# Patient Record
Sex: Female | Born: 1975 | Race: Black or African American | Hispanic: No | Smoking: Never smoker
Health system: Southern US, Community
[De-identification: ages and names within clinical notes are randomized; demographics above are authoritative.]

---

## 2013-07-27 ENCOUNTER — Emergency Department (HOSPITAL_BASED_OUTPATIENT_CLINIC_OR_DEPARTMENT_OTHER)
Admission: EM | Admit: 2013-07-27 | Discharge: 2013-07-27 | Disposition: A | Discharge status: Home / Self Care | Payer: BC Managed Care – PPO | Attending: Emergency Medicine | Admitting: Emergency Medicine

## 2013-07-27 ENCOUNTER — Encounter (HOSPITAL_BASED_OUTPATIENT_CLINIC_OR_DEPARTMENT_OTHER): Payer: Self-pay | Admitting: Emergency Medicine

## 2013-07-27 ENCOUNTER — Emergency Department (HOSPITAL_BASED_OUTPATIENT_CLINIC_OR_DEPARTMENT_OTHER): Payer: BC Managed Care – PPO

## 2013-07-27 DIAGNOSIS — Y929 Unspecified place or not applicable: Secondary | ICD-10-CM | POA: Insufficient documentation

## 2013-07-27 DIAGNOSIS — Y939 Activity, unspecified: Secondary | ICD-10-CM | POA: Insufficient documentation

## 2013-07-27 DIAGNOSIS — W010XXA Fall on same level from slipping, tripping and stumbling without subsequent striking against object, initial encounter: Secondary | ICD-10-CM | POA: Insufficient documentation

## 2013-07-27 DIAGNOSIS — S82899A Other fracture of unspecified lower leg, initial encounter for closed fracture: Secondary | ICD-10-CM | POA: Insufficient documentation

## 2013-07-27 DIAGNOSIS — S82402A Unspecified fracture of shaft of left fibula, initial encounter for closed fracture: Secondary | ICD-10-CM

## 2013-07-27 MED ORDER — IBUPROFEN 800 MG PO TABS: 800.0000 mg | ORAL_TABLET | Freq: Three times a day (TID) | ORAL | Status: AC

## 2013-07-27 MED ORDER — IBUPROFEN 800 MG PO TABS: 800.0000 mg | ORAL_TABLET | Freq: Three times a day (TID) | ORAL | Status: DC

## 2013-07-27 MED ORDER — HYDROCODONE-ACETAMINOPHEN 5-325 MG PO TABS: 2.0000 | ORAL_TABLET | ORAL | Status: DC | PRN

## 2013-07-27 NOTE — ED Notes (Signed)
pa at bedside. 

## 2013-07-27 NOTE — Discharge Instructions (Signed)
Cast or Splint Care °Casts and splints support injured limbs and keep bones from moving while they heal. It is important to care for your cast or splint at home.   °HOME CARE INSTRUCTIONS °· Keep the cast or splint uncovered during the drying period. It can take 24 to 48 hours to dry if it is made of plaster. A fiberglass cast will dry in less than 1 hour. °· Do not rest the cast on anything harder than a pillow for the first 24 hours. °· Do not put weight on your injured limb or apply pressure to the cast until your health care provider gives you permission. °· Keep the cast or splint dry. Wet casts or splints can lose their shape and may not support the limb as well. A wet cast that has lost its shape can also create harmful pressure on your skin when it dries. Also, wet skin can become infected. °· Cover the cast or splint with a plastic bag when bathing or when out in the rain or snow. If the cast is on the trunk of the body, take sponge baths until the cast is removed. °· If your cast does become wet, dry it with a towel or a blow dryer on the cool setting only. °· Keep your cast or splint clean. Soiled casts may be wiped with a moistened cloth. °· Do not place any hard or soft foreign objects under your cast or splint, such as cotton, toilet paper, lotion, or powder. °· Do not try to scratch the skin under the cast with any object. The object could get stuck inside the cast. Also, scratching could lead to an infection. If itching is a problem, use a blow dryer on a cool setting to relieve discomfort. °· Do not trim or cut your cast or remove padding from inside of it. °· Exercise all joints next to the injury that are not immobilized by the cast or splint. For example, if you have a long leg cast, exercise the hip joint and toes. If you have an arm cast or splint, exercise the shoulder, elbow, thumb, and fingers. °· Elevate your injured arm or leg on 1 or 2 pillows for the first 1 to 3 days to decrease  swelling and pain. It is best if you can comfortably elevate your cast so it is higher than your heart. °SEEK MEDICAL CARE IF:  °· Your cast or splint cracks. °· Your cast or splint is too tight or too loose. °· You have unbearable itching inside the cast. °· Your cast becomes wet or develops a soft spot or area. °· You have a bad smell coming from inside your cast. °· You get an object stuck under your cast. °· Your skin around the cast becomes red or raw. °· You have new pain or worsening pain after the cast has been applied. °SEEK IMMEDIATE MEDICAL CARE IF:  °· You have fluid leaking through the cast. °· You are unable to move your fingers or toes. °· You have discolored (blue or white), cool, painful, or very swollen fingers or toes beyond the cast. °· You have tingling or numbness around the injured area. °· You have severe pain or pressure under the cast. °· You have any difficulty with your breathing or have shortness of breath. °· You have chest pain. °Document Released: 05/18/2000 Document Revised: 03/11/2013 Document Reviewed: 11/27/2012 °[employer]® Patient Information ©2014 [employer]. ° °Ankle Fracture °A fracture is a break in the bone. A cast or splint   is used to protect and keep your injured bone from moving.  °HOME CARE INSTRUCTIONS  °· Use your crutches as directed. °· To lessen the swelling, keep the injured leg elevated while sitting or lying down. °· Apply ice to the injury for 15-20 minutes, 03-04 times per day while awake for 2 days. Put the ice in a plastic bag and place a thin towel between the bag of ice and your cast. °· If you have a plaster or fiberglass cast: °· Do not try to scratch the skin under the cast using sharp or pointed objects. °· Check the skin around the cast every day. You may put lotion on any red or sore areas. °· Keep your cast dry and clean. °· If you have a plaster splint: °· Wear the splint as directed. °· You may loosen the elastic around the splint if your toes  become numb, tingle, or turn cold or blue. °· Do not put pressure on any part of your cast or splint; it may break. Rest your cast only on a pillow the first 24 hours until it is fully hardened. °· Your cast or splint can be protected during bathing with a plastic bag. Do not lower the cast or splint into water. °· Take medications as directed by your caregiver. Only take over-the-counter or prescription medicines for pain, discomfort, or fever as directed by your caregiver. °· Do not drive a vehicle until your caregiver specifically tells you it is safe to do so. °· If your caregiver has given you a follow-up appointment, it is very important to keep that appointment. Not keeping the appointment could result in a chronic or permanent injury, pain, and disability. If there is any problem keeping the appointment, you must call back to this facility for assistance. °SEEK IMMEDIATE MEDICAL CARE IF:  °· Your cast gets damaged or breaks. °· You have continued severe pain or more swelling than you did before the cast was put on. °· Your skin or toenails below the injury turn blue or gray, or feel cold or numb. °· There is a bad smell or new stains and/or purulent (pus like) drainage coming from under the cast. °If you do not have a window in your cast for observing the wound, a discharge or minor bleeding may show up as a stain on the outside of your cast. Report these findings to your caregiver. °MAKE SURE YOU:  °· Understand these instructions. °· Will watch your condition. °· Will get help right away if you are not doing well or get worse. °Document Released: 05/18/2000 Document Revised: 08/13/2011 Document Reviewed: 12/18/2012 °[employer]® Patient Information ©2014 [employer]. ° °

## 2013-07-27 NOTE — ED Provider Notes (Signed)
Medical screening examination/treatment/procedure(s) were performed by non-physician practitioner and as supervising physician I was immediately available for consultation/collaboration.  Gailen Venne, MD 07/27/13 2345   Medical screening examination/treatment/procedure(s) were performed by non-physician practitioner and as supervising physician I was immediately available for consultation/collaboration.     Gerhard Munch, MD 07/27/13 [phone]

## 2013-07-27 NOTE — ED Provider Notes (Signed)
CSN: [id number]     Arrival date & time 07/27/13  1454 History   First MD Initiated Contact with Patient 07/27/13 1634     Chief Complaint  Patient presents with  . Ankle Pain     (Consider location/radiation/quality/duration/timing/severity/associated sxs/prior Treatment) Patient is a 38 y.o. female presenting with ankle pain. The history is provided by the patient. No language interpreter was used.  Ankle Pain Location:  Ankle Time since incident:  8 days Ankle location:  L ankle Pain details:    Quality:  Aching   Radiates to:  Does not radiate   Severity:  Moderate   Onset quality:  Gradual   Duration:  8 days   Timing:  Constant   Progression:  Worsening Chronicity:  New Dislocation: no   Prior injury to area:  No Relieved by:  Nothing Worsened by:  Nothing tried Ineffective treatments:  None tried   History reviewed. No pertinent past medical history. History reviewed. No pertinent past surgical history. No family history on file. History  Substance Use Topics  . Smoking status: Never Smoker   . Smokeless tobacco: Not on file  . Alcohol Use: Yes   OB History   Grav Para Term Preterm Abortions TAB SAB Ect Mult Living                 Review of Systems  Musculoskeletal: Positive for joint swelling and myalgias.  All other systems reviewed and are negative.      Allergies  Review of patient's allergies indicates no known allergies.  Home Medications  No current outpatient prescriptions on file. BP 171/89  Pulse 66  Temp(Src) 98.4 F (36.9 C) (Oral)  Resp 16  Ht 5\' 5"  (1.651 m)  Wt 195 lb (88.451 kg)  BMI 32.45 kg/m2  SpO2 98%  LMP 07/19/2013 Physical Exam  Nursing note and vitals reviewed. Constitutional: She is oriented to person, place, and time. She appears well-developed and well-nourished.  HENT:  Head: Normocephalic.  Eyes: Conjunctivae are normal. Pupils are equal, round, and reactive to light.  Musculoskeletal: She exhibits  tenderness.  Swollen tender left ankle,   Decreased range of motion,   Right ankle slight swelling,  From,  nv and ns intact  Neurological: She is alert and oriented to person, place, and time. She has normal reflexes.  Skin: Skin is warm.  Psychiatric: She has a normal mood and affect.    ED Course  Procedures (including critical care time) Labs Review Labs Reviewed - No data to display Imaging Review Dg Ankle Complete Left  07/27/2013   CLINICAL DATA:  Fall 8 days ago.  Left ankle pain and swelling.  EXAM: LEFT ANKLE COMPLETE - 3+ VIEW  COMPARISON:  None.  FINDINGS: The patient has a nondisplaced distal fibular fracture with associated soft tissue swelling. No other acute bony or joint abnormality is identified.  IMPRESSION: Nondisplaced distal fibular fracture with associated soft tissue swelling.   Electronically Signed   By: Drusilla Kanner M.D.   On: 07/27/2013 15:55   Dg Foot Complete Left  07/27/2013   CLINICAL DATA:  Left foot pain.  EXAM: LEFT FOOT - COMPLETE 3+ VIEW  COMPARISON:  None.  FINDINGS: Nondisplaced distal fibular fracture is identified. Image bones otherwise appear normal. No dislocation.  IMPRESSION: Nondisplaced distal fibular fracture.  Otherwise negative.   Electronically Signed   By: Drusilla Kanner M.D.   On: 07/27/2013 15:55    EKG Interpretation   None  MDM   Final diagnoses:  Fracture of fibula, left, closed  Rx for ibuprofen and hydrocodone Cam walker,  Crutches  Follow up with Dr. Allena NapoleonHudnall    Toben Acuna K Ilsa Bonello, PA-C 07/27/13 1751

## 2013-07-27 NOTE — ED Notes (Signed)
Pt c/o left ankle, foot and lower leg swelling and painful since tripping and falling 8 days ago.

## 2013-07-29 ENCOUNTER — Encounter: Payer: Self-pay | Admitting: Family Medicine

## 2013-07-29 ENCOUNTER — Ambulatory Visit (INDEPENDENT_AMBULATORY_CARE_PROVIDER_SITE_OTHER): Payer: BC Managed Care – PPO | Admitting: Family Medicine

## 2013-07-29 VITALS — BP 189/79 | HR 87 | Ht 65.0 in | Wt 190.0 lb

## 2013-07-29 DIAGNOSIS — S99912A Unspecified injury of left ankle, initial encounter: Secondary | ICD-10-CM

## 2013-07-29 DIAGNOSIS — S8990XA Unspecified injury of unspecified lower leg, initial encounter: Secondary | ICD-10-CM

## 2013-07-29 DIAGNOSIS — S99929A Unspecified injury of unspecified foot, initial encounter: Secondary | ICD-10-CM

## 2013-07-29 DIAGNOSIS — S99919A Unspecified injury of unspecified ankle, initial encounter: Secondary | ICD-10-CM

## 2013-07-29 DIAGNOSIS — S82409A Unspecified fracture of shaft of unspecified fibula, initial encounter for closed fracture: Secondary | ICD-10-CM

## 2013-07-29 DIAGNOSIS — S82402A Unspecified fracture of shaft of left fibula, initial encounter for closed fracture: Secondary | ICD-10-CM

## 2013-07-29 MED ORDER — HYDROCODONE-ACETAMINOPHEN 5-325 MG PO TABS: 1.0000 | ORAL_TABLET | Freq: Four times a day (QID) | ORAL | Status: AC | PRN

## 2013-07-29 NOTE — Patient Instructions (Signed)
You have a distal fibula fracture. Use crutches and do not put weight on this leg. Wear cam walker when you're going to be up and around - ok to take off to clean, ice, sleep (unless painful). Ice 15 minutes at a time 3-4 times a day. Elevate above the level of your heart as much as possible. Aleve 2 tabs twice a day with food OR ibuprofen 600mg  three times a day with food for pain and inflammation. Norco as needed for severe pain (no driving on this medicine). No driving with boot on. Follow up with me in 2 weeks. Out of work in meantime.

## 2013-07-31 ENCOUNTER — Encounter: Payer: Self-pay | Admitting: Family Medicine

## 2013-07-31 DIAGNOSIS — S82402A Unspecified fracture of shaft of left fibula, initial encounter for closed fracture: Secondary | ICD-10-CM | POA: Insufficient documentation

## 2013-07-31 NOTE — Progress Notes (Signed)
Patient ID: Allison Hansen, female   DOB: [birth year], 38 y.o.   MRN: [id number]  PCP: No PCP Per Patient  Subjective:   HPI: Patient is a 38 y.o. female here for left leg injury.  Patient reports on 2/15 she was on a concrete sidewalk and missed a step, inverted left ankle. Immediate pain, lots of swelling lateral ankle. Hobbled after this - could put a little weight on it. Went to ED where x-rays showed a distal fibula fracture into level of ankle joint. Taking ibuprofen, hydrocodone. Using cam walker and crutches. Elevating also. No prior injuries.  No past medical history on file.  Current Outpatient Prescriptions on File Prior to Visit  Medication Sig Dispense Refill  . ibuprofen (ADVIL,MOTRIN) 800 MG tablet Take 1 tablet (800 mg total) by mouth 3 (three) times daily.  21 tablet  0   No current facility-administered medications on file prior to visit.    No past surgical history on file.  No Known Allergies  History   Social History  . Marital Status: Divorced    Spouse Name: N/A    Number of Children: N/A  . Years of Education: N/A   Occupational History  . Not on file.   Social History Main Topics  . Smoking status: Never Smoker   . Smokeless tobacco: Not on file  . Alcohol Use: Yes  . Drug Use: No  . Sexual Activity: Not on file   Other Topics Concern  . Not on file   Social History Narrative  . No narrative on file    Family History  Problem Relation Age of Onset  . Hypertension Mother   . Sudden death Neg Hx   . Hyperlipidemia Neg Hx   . Heart attack Neg Hx   . Diabetes Neg Hx     BP 189/79  Pulse 87  Ht 5\' 5"  (1.651 m)  Wt 190 lb (86.183 kg)  BMI 31.62 kg/m2  LMP 07/19/2013  Review of Systems: See HPI above.    Objective:  Physical Exam:  Gen: NAD  Left ankle/foot: Mod swelling, mild bruising laterally.  No other deformity. Did not test ROM with known fracture. TTP lateral malleolus about level of ankle joint.  Mild pain over  ATFL.  No pain medial malleolus or over deltoid ligament. Deferred ant drawer and talar tilt.   Deferred syndesmotic compression. Thompsons test negative. NV intact distally.    Assessment & Plan:  1. Left distal fibula fracture - fracture line extends to level of ankle joint.  Mortise intact and no tenderness medially to suggest unstable ankle joint.  Will start with conservative treatment - no weight bearing in cam boot with crutches.  Icing, elevating, nsaids with norco for severe pain.  Out of work.  F/u in 2 weeks to repeat radiographs. Expect 6-8 weeks from injury for complete healing.

## 2013-07-31 NOTE — Assessment & Plan Note (Signed)
Left distal fibula fracture - fracture line extends to level of ankle joint.  Mortise intact and no tenderness medially to suggest unstable ankle joint.  Will start with conservative treatment - no weight bearing in cam boot with crutches.  Icing, elevating, nsaids with norco for severe pain.  Out of work.  F/u in 2 weeks to repeat radiographs. Expect 6-8 weeks from injury for complete healing.

## 2013-08-12 ENCOUNTER — Ambulatory Visit (HOSPITAL_BASED_OUTPATIENT_CLINIC_OR_DEPARTMENT_OTHER)
Admission: RE | Admit: 2013-08-12 | Discharge: 2013-08-12 | Disposition: A | Discharge status: Home / Self Care | Payer: BC Managed Care – PPO | Source: Ambulatory Visit | Attending: Family Medicine | Admitting: Family Medicine

## 2013-08-12 ENCOUNTER — Ambulatory Visit (INDEPENDENT_AMBULATORY_CARE_PROVIDER_SITE_OTHER): Payer: BC Managed Care – PPO | Admitting: Family Medicine

## 2013-08-12 ENCOUNTER — Encounter: Payer: Self-pay | Admitting: Family Medicine

## 2013-08-12 VITALS — BP 176/91 | HR 86 | Ht 65.0 in | Wt 190.0 lb

## 2013-08-12 DIAGNOSIS — S99912A Unspecified injury of left ankle, initial encounter: Secondary | ICD-10-CM

## 2013-08-12 DIAGNOSIS — Z4789 Encounter for other orthopedic aftercare: Secondary | ICD-10-CM | POA: Insufficient documentation

## 2013-08-12 DIAGNOSIS — S82402A Unspecified fracture of shaft of left fibula, initial encounter for closed fracture: Secondary | ICD-10-CM

## 2013-08-12 DIAGNOSIS — S99919A Unspecified injury of unspecified ankle, initial encounter: Secondary | ICD-10-CM

## 2013-08-12 DIAGNOSIS — S8990XA Unspecified injury of unspecified lower leg, initial encounter: Secondary | ICD-10-CM

## 2013-08-12 DIAGNOSIS — S82409A Unspecified fracture of shaft of unspecified fibula, initial encounter for closed fracture: Secondary | ICD-10-CM

## 2013-08-12 DIAGNOSIS — S99929A Unspecified injury of unspecified foot, initial encounter: Secondary | ICD-10-CM

## 2013-08-13 ENCOUNTER — Encounter: Payer: Self-pay | Admitting: Family Medicine

## 2013-08-13 NOTE — Assessment & Plan Note (Signed)
Left distal fibula fracture - radiographs show no displacement compared to last radiographs - no early healing yet.  Stressed importance of not weight bearing with this fracture extending to level of ankle joint.  F/u in 4 weeks for reevaluation.  Repeat radiographs at that time.  Icing, elevating, nsaids with norco for severe pain.  Continued out of work.  Expect 6-8 weeks from injury for complete healing.

## 2013-08-13 NOTE — Progress Notes (Signed)
Patient ID: Allison Hansen, female   DOB: 23-Sep-1975, 38 y.o.   MRN: [id number]  PCP: No PCP Per Patient  Subjective:   HPI: Patient is a 38 y.o. female here for left leg injury.  2/25: Patient reports on 2/15 she was on a concrete sidewalk and missed a step, inverted left ankle. Immediate pain, lots of swelling lateral ankle. Hobbled after this - could put a little weight on it. Went to ED where x-rays showed a distal fibula fracture into level of ankle joint. Taking ibuprofen, hydrocodone. Using cam walker and crutches. Elevating also. No prior injuries.  3/11: Patient reports pain has improved but still a 4/10. Taking aleve as needed. Using cam walker and crutches though is putting some weight on this. Elevating and icing.  History reviewed. No pertinent past medical history.  Current Outpatient Prescriptions on File Prior to Visit  Medication Sig Dispense Refill  . HYDROcodone-acetaminophen (NORCO/VICODIN) 5-325 MG per tablet Take 1 tablet by mouth every 6 (six) hours as needed.  60 tablet  0  . ibuprofen (ADVIL,MOTRIN) 800 MG tablet Take 1 tablet (800 mg total) by mouth 3 (three) times daily.  21 tablet  0   No current facility-administered medications on file prior to visit.    History reviewed. No pertinent past surgical history.  No Known Allergies  History   Social History  . Marital Status: Divorced    Spouse Name: N/A    Number of Children: N/A  . Years of Education: N/A   Occupational History  . Not on file.   Social History Main Topics  . Smoking status: Never Smoker   . Smokeless tobacco: Not on file  . Alcohol Use: Yes  . Drug Use: No  . Sexual Activity: Not on file   Other Topics Concern  . Not on file   Social History Narrative  . No narrative on file    Family History  Problem Relation Age of Onset  . Hypertension Mother   . Sudden death Neg Hx   . Hyperlipidemia Neg Hx   . Heart attack Neg Hx   . Diabetes Neg Hx     BP 176/91   Pulse 86  Ht 5\' 5"  (1.651 m)  Wt 190 lb (86.183 kg)  BMI 31.62 kg/m2  LMP 07/19/2013  Review of Systems: See HPI above.    Objective:  Physical Exam:  Gen: NAD  Left ankle/foot: Mild swelling, mild bruising laterally.  No other deformity. Did not test ROM with known fracture. TTP lateral malleolus about level of ankle joint.  Mild pain over ATFL.  No pain medial malleolus or over deltoid ligament. Deferred ant drawer and talar tilt.   Deferred syndesmotic compression. Thompsons test negative. NV intact distally.    Assessment & Plan:  1. Left distal fibula fracture - radiographs show no displacement compared to last radiographs - no early healing yet.  Stressed importance of not weight bearing with this fracture extending to level of ankle joint.  F/u in 4 weeks for reevaluation.  Repeat radiographs at that time.  Icing, elevating, nsaids with norco for severe pain.  Continued out of work.  Expect 6-8 weeks from injury for complete healing.

## 2013-09-09 ENCOUNTER — Ambulatory Visit (HOSPITAL_BASED_OUTPATIENT_CLINIC_OR_DEPARTMENT_OTHER)
Admission: RE | Admit: 2013-09-09 | Discharge: 2013-09-09 | Disposition: A | Discharge status: Home / Self Care | Payer: BC Managed Care – PPO | Source: Ambulatory Visit | Attending: Family Medicine | Admitting: Family Medicine

## 2013-09-09 ENCOUNTER — Encounter: Payer: Self-pay | Admitting: Family Medicine

## 2013-09-09 ENCOUNTER — Ambulatory Visit (INDEPENDENT_AMBULATORY_CARE_PROVIDER_SITE_OTHER): Payer: BC Managed Care – PPO | Admitting: Family Medicine

## 2013-09-09 VITALS — BP 169/105 | HR 77 | Ht 65.0 in | Wt 180.0 lb

## 2013-09-09 DIAGNOSIS — S82899A Other fracture of unspecified lower leg, initial encounter for closed fracture: Secondary | ICD-10-CM | POA: Insufficient documentation

## 2013-09-09 DIAGNOSIS — S82402A Unspecified fracture of shaft of left fibula, initial encounter for closed fracture: Secondary | ICD-10-CM

## 2013-09-09 DIAGNOSIS — M25572 Pain in left ankle and joints of left foot: Secondary | ICD-10-CM

## 2013-09-09 DIAGNOSIS — W19XXXA Unspecified fall, initial encounter: Secondary | ICD-10-CM | POA: Insufficient documentation

## 2013-09-09 DIAGNOSIS — S82409A Unspecified fracture of shaft of unspecified fibula, initial encounter for closed fracture: Secondary | ICD-10-CM

## 2013-09-09 DIAGNOSIS — M25579 Pain in unspecified ankle and joints of unspecified foot: Secondary | ICD-10-CM

## 2013-09-10 ENCOUNTER — Encounter: Payer: Self-pay | Admitting: Family Medicine

## 2013-09-10 NOTE — Progress Notes (Signed)
Patient ID: Allison Hansen, female   DOB: 1975/12/18, 38 y.o.   MRN: [id number]  PCP: No PCP Per Patient  Subjective:   HPI: Patient is a 38 y.o. female here for left leg injury.  2/25: Patient reports on 2/15 she was on a concrete sidewalk and missed a step, inverted left ankle. Immediate pain, lots of swelling lateral ankle. Hobbled after this - could put a little weight on it. Went to ED where x-rays showed a distal fibula fracture into level of ankle joint. Taking ibuprofen, hydrocodone. Using cam walker and crutches. Elevating also. No prior injuries.  3/11: Patient reports pain has improved but still a 4/10. Taking aleve as needed. Using cam walker and crutches though is putting some weight on this. Elevating and icing.  4/8: Patient reports she is about 50% improved. Using cam walker and has been putting a little weight on this. Using crutches. Aleve if needed.  History reviewed. No pertinent past medical history.  Current Outpatient Prescriptions on File Prior to Visit  Medication Sig Dispense Refill  . HYDROcodone-acetaminophen (NORCO/VICODIN) 5-325 MG per tablet Take 1 tablet by mouth every 6 (six) hours as needed.  60 tablet  0  . ibuprofen (ADVIL,MOTRIN) 800 MG tablet Take 1 tablet (800 mg total) by mouth 3 (three) times daily.  21 tablet  0   No current facility-administered medications on file prior to visit.    History reviewed. No pertinent past surgical history.  No Known Allergies  History   Social History  . Marital Status: Divorced    Spouse Name: N/A    Number of Children: N/A  . Years of Education: N/A   Occupational History  . Not on file.   Social History Main Topics  . Smoking status: Never Smoker   . Smokeless tobacco: Not on file  . Alcohol Use: Yes  . Drug Use: No  . Sexual Activity: Not on file   Other Topics Concern  . Not on file   Social History Narrative  . No narrative on file    Family History  Problem Relation  Age of Onset  . Hypertension Mother   . Sudden death Neg Hx   . Hyperlipidemia Neg Hx   . Heart attack Neg Hx   . Diabetes Neg Hx     BP 169/105  Pulse 77  Ht 5\' 5"  (1.651 m)  Wt 180 lb (81.647 kg)  BMI 29.95 kg/m2  Review of Systems: See HPI above.    Objective:  Physical Exam:  Gen: NAD  Left ankle/foot: Mild swelling, no bruising laterally.  No other deformity. Mod limited ROM all directions. No longer with TTP lateral malleolus about level of ankle joint.  No pain over ATFL.  No pain medial malleolus or over deltoid ligament. Negative ant drawer and talar tilt.   Negative syndesmotic compression. Thompsons test negative. NV intact distally.    Assessment & Plan:  1. Left distal fibula fracture - Radiographs with some possible blurring of fracture lines but no callus.  Clinically she has no tenderness but not as healed as would hope at this point.  Ultrasound shows a little blurring and excellent neovascularity in area of fracture site which is reassuring despite today's radiographs.  Advised we continue with current treatment for an additional 6 weeks and reevaluate at that time, consider radiographs and ultrasound depending on her exam and pain level.  She will return to light duty (no weight bearing on left leg, crutches) on Monday.  F/u in  6 weeks.

## 2013-09-10 NOTE — Assessment & Plan Note (Signed)
Left distal fibula fracture - Radiographs with some possible blurring of fracture lines but no callus.  Clinically she has no tenderness but not as healed as would hope at this point.  Ultrasound shows a little blurring and excellent neovascularity in area of fracture site which is reassuring despite today's radiographs.  Advised we continue with current treatment for an additional 6 weeks and reevaluate at that time, consider radiographs and ultrasound depending on her exam and pain level.  She will return to light duty (no weight bearing on left leg, crutches) on Monday.  F/u in 6 weeks.

## 2013-09-29 ENCOUNTER — Telehealth: Payer: Self-pay | Admitting: Family Medicine

## 2013-09-29 DIAGNOSIS — S82402A Unspecified fracture of shaft of left fibula, initial encounter for closed fracture: Secondary | ICD-10-CM

## 2013-09-29 NOTE — Telephone Encounter (Signed)
It's a little too early to repeat x-rays (we discussed 6 weeks from her visit on 4/8).  If she's still hurting quite a bit and wants to see orthopedic surgery a little early we can go ahead with a referral - they would repeat x-rays there.  Gunnar FusiPaula please relay message to patient and see what other questions she might have.  Thanks!

## 2013-10-01 ENCOUNTER — Other Ambulatory Visit: Payer: Self-pay | Admitting: *Deleted

## 2013-10-01 DIAGNOSIS — S82402A Unspecified fracture of shaft of left fibula, initial encounter for closed fracture: Secondary | ICD-10-CM

## 2013-10-01 NOTE — Telephone Encounter (Signed)
Spoke with patient and about her options.  Feels like has not improved at all since last visit and has continued to be non-weight bearing.  Advised we should go ahead with CT of ankle to assess for degree of bony healing.  Discussed other options (referral to foot/ankle surgeon, MRI) as well.

## 2013-10-01 NOTE — Telephone Encounter (Signed)
Spoke with patient other day and she stated that she feels like no progress has been made since last visit on 09-09-13. Wants to speak directly to you to get your opinion. Not hurting, but an uncomfortable feeling.

## 2013-10-02 ENCOUNTER — Ambulatory Visit (HOSPITAL_BASED_OUTPATIENT_CLINIC_OR_DEPARTMENT_OTHER)
Admission: RE | Admit: 2013-10-02 | Discharge: 2013-10-02 | Disposition: A | Discharge status: Home / Self Care | Payer: BC Managed Care – PPO | Source: Ambulatory Visit | Attending: Family Medicine | Admitting: Family Medicine

## 2013-10-02 DIAGNOSIS — S82402A Unspecified fracture of shaft of left fibula, initial encounter for closed fracture: Secondary | ICD-10-CM

## 2013-10-02 DIAGNOSIS — IMO0002 Reserved for concepts with insufficient information to code with codable children: Secondary | ICD-10-CM | POA: Insufficient documentation

## 2013-10-06 ENCOUNTER — Encounter: Payer: Self-pay | Admitting: Family Medicine

## 2013-10-06 NOTE — Progress Notes (Signed)
Patient ID: Allison Hansen, female   DOB: 11/27/1975, 38 y.o.   MRN: [id number]  CT scan reviewed and discussed with patient.  Unfortunately still no callus formation, soft callus, evidence of healing of her distal fibula fracture.  At this point will refer to foot/ankle specialist to discuss possible ORIF.

## 2013-10-07 ENCOUNTER — Other Ambulatory Visit: Payer: Self-pay | Admitting: *Deleted

## 2013-10-07 DIAGNOSIS — S82402A Unspecified fracture of shaft of left fibula, initial encounter for closed fracture: Secondary | ICD-10-CM

## 2013-10-21 ENCOUNTER — Ambulatory Visit: Payer: BC Managed Care – PPO | Admitting: Family Medicine

## 2014-12-11 IMAGING — CT CT ANKLE*L* W/O CM
3 of 5 series · 14 of 33 positions shown, 16 images · non-contrast
Comparison: DG ANKLE COMPLETE*L* dated 09/09/2013; DG ANKLE
COMPLETE*L* dated 07/27/2013

CLINICAL DATA: Evaluate lateral malleolar fracture

EXAM:
CT OF THE LEFT ANKLE WITHOUT CONTRAST
TECHNIQUE: Multidetector CT imaging was performed according to the standard
protocol. Multiplanar CT image reconstructions were also generated.

[Series 3: foot/ankle 2.0 b31s · axial · 0.28mm/px · z∈[+282,+372]mm · 8 of 55 slices shown, 10 images]
[im 5/55  soft-tissue]
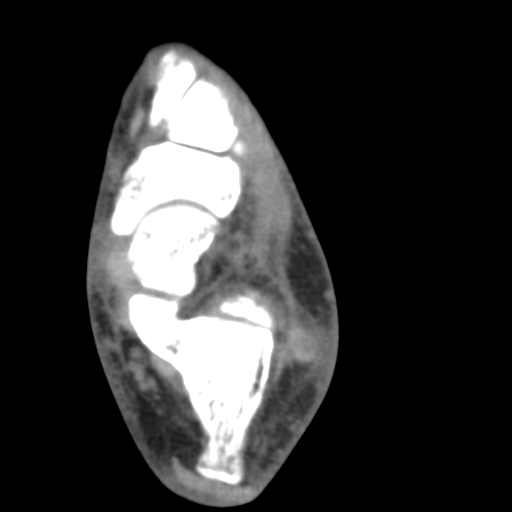
[im 5/55  bone]
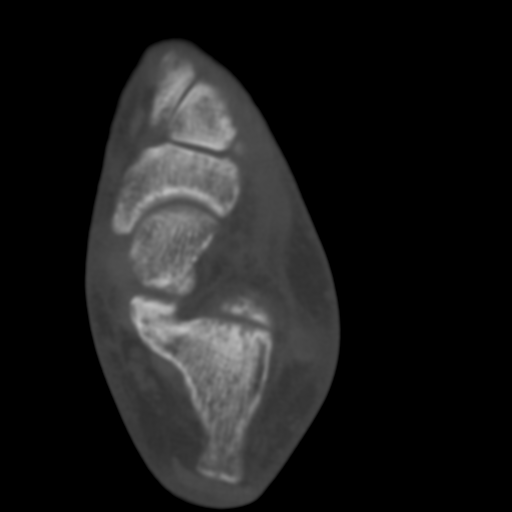
[im 13/55  bone]
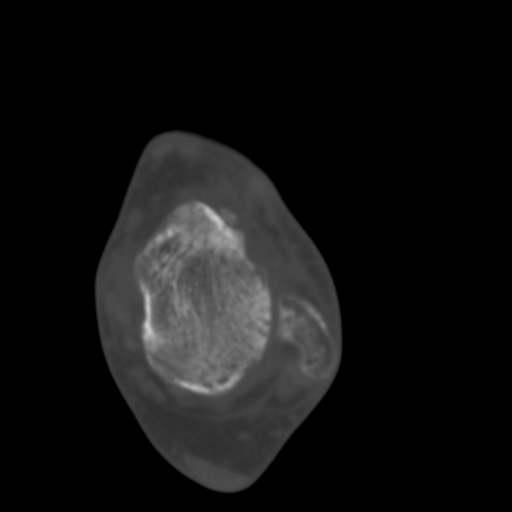
[im 17/55  bone]
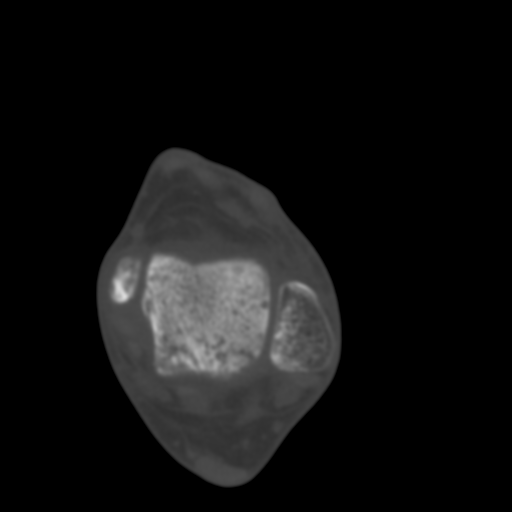
[im 25/55  bone]
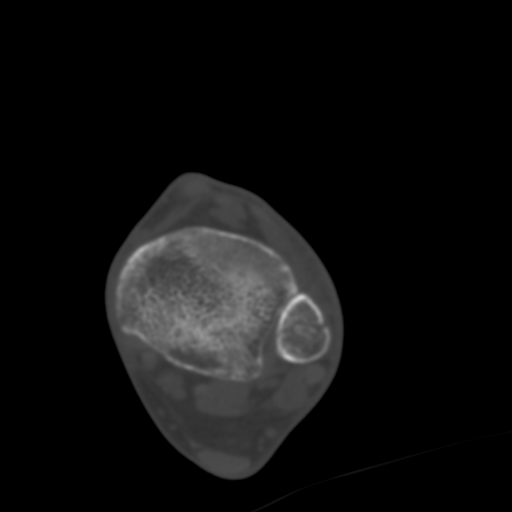
[im 30/55  soft-tissue]
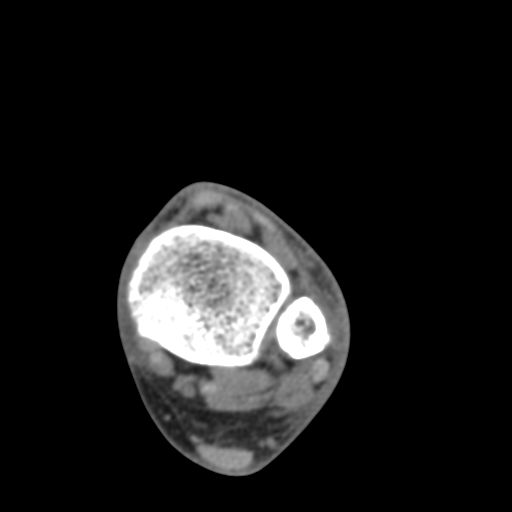
[im 30/55  bone]
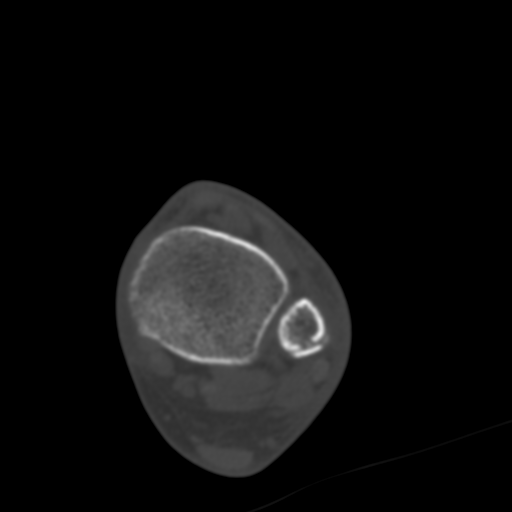
[im 38/55  bone]
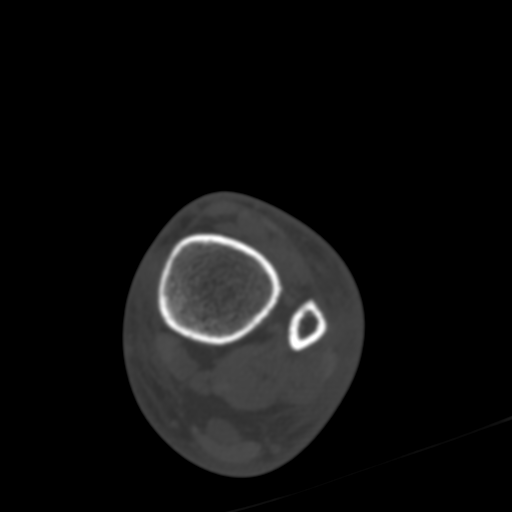
[im 42/55  bone]
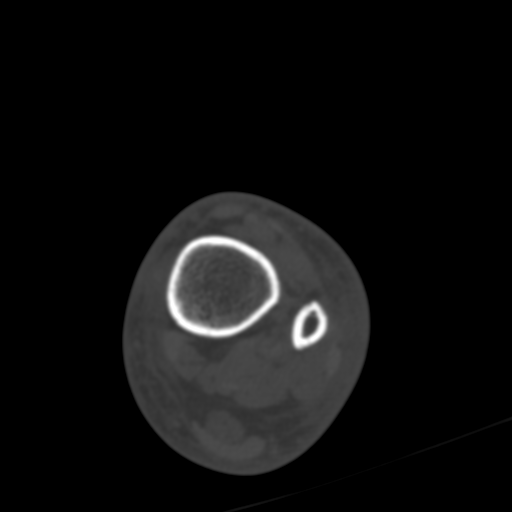
[im 50/55  bone]
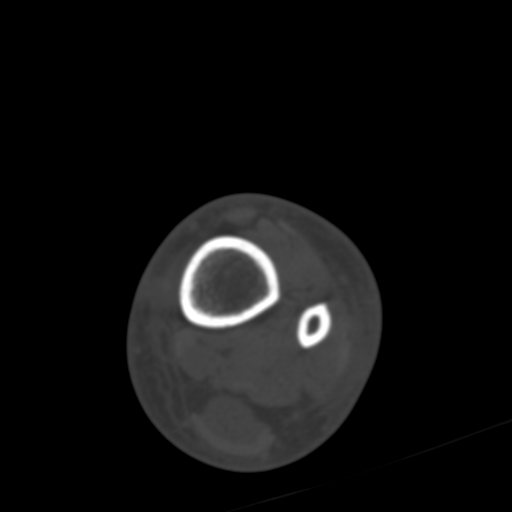

[Series 5: foot/ankle 2.0 coronal · coronal · 0.29mm/px · 1 of 37 slices shown]
[im 19/37  bone]
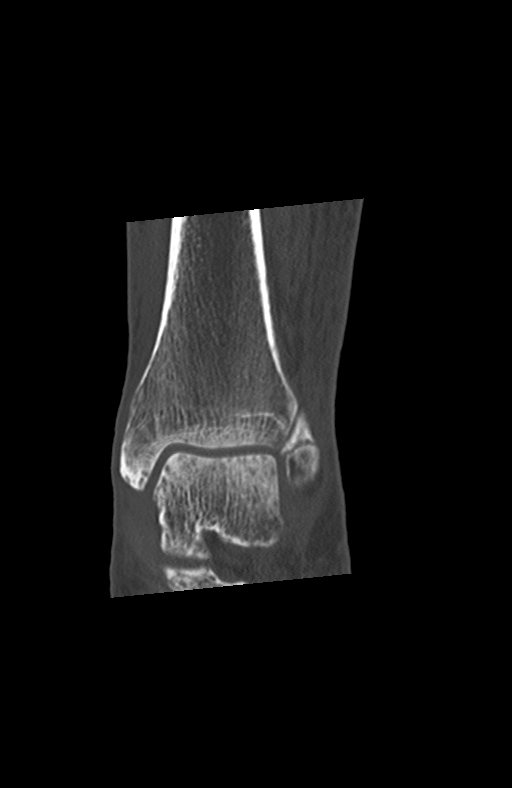

[Series 6: foot/ankle 2.0 sagittal · sagittal · 0.32mm/px · 5 of 38 slices shown]
[im 7/38  bone]
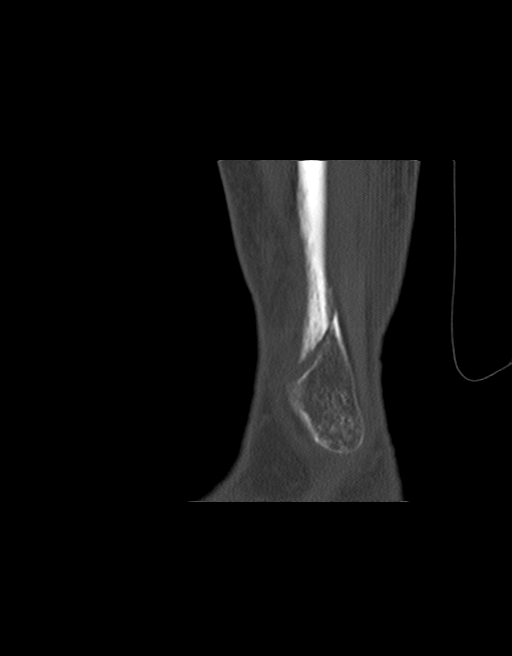
[im 13/38  bone]
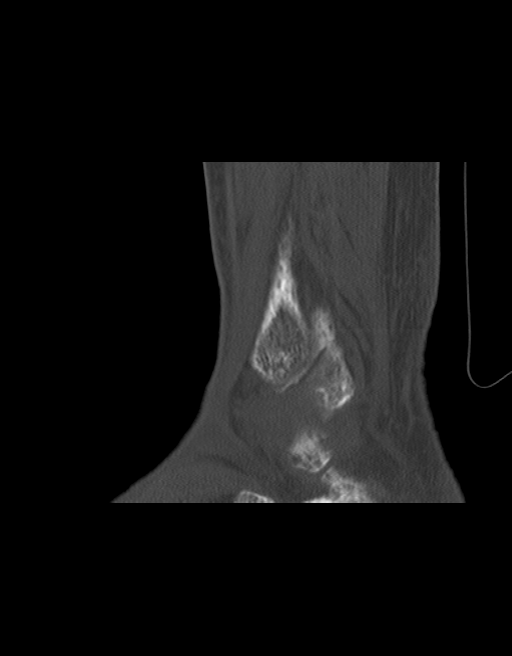
[im 19/38  bone]
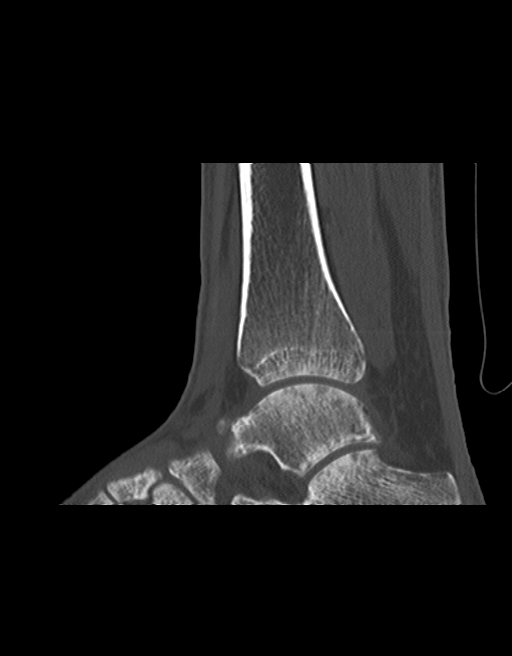
[im 25/38  bone]
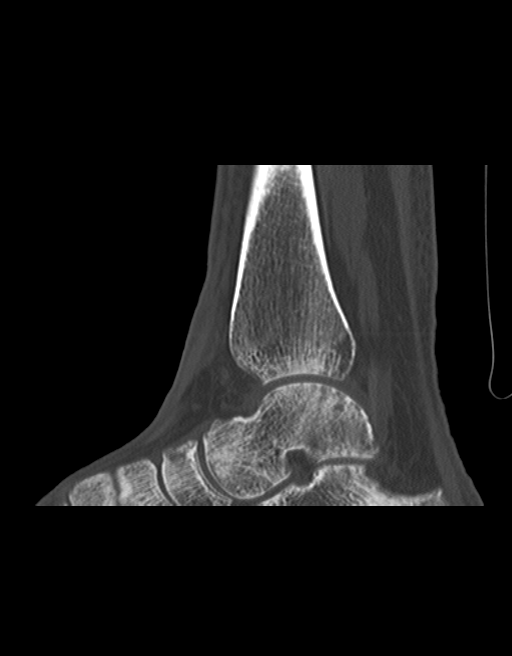
[im 31/38  bone]
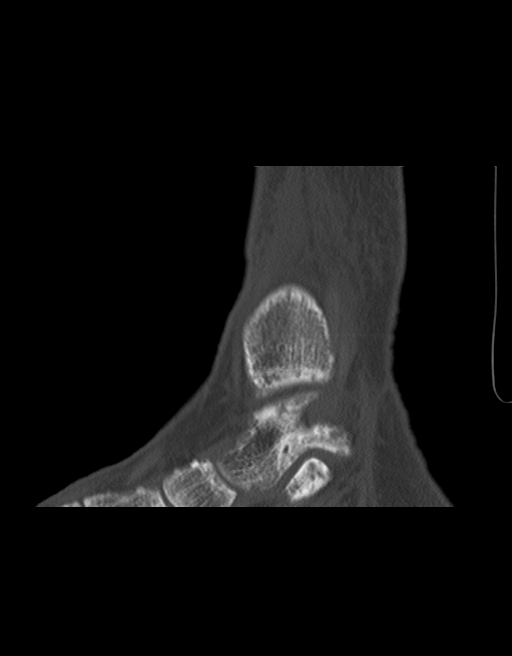

[14 of 33 positions shown; findings below may reference images not displayed]

FINDINGS: There is an ununited oblique fracture of the distal fibular
diametaphyseal this without callus formation with persistence of the
fracture cleft. There is minimal displacement measuring 1 mm. There
is no angulation. The alignment is near anatomic. There is
generalized osteopenia which is likely related to disuse. There is
mild soft tissue swelling over the lateral ankle.

There is no other fracture or dislocation. The ankle mortise is
intact.

The flexor, extensor, peroneal and Achilles tendons are intact.
There is no fluid collection or hematoma.
IMPRESSION: Nonunion of an oblique distal left fibular diametaphyseal fracture
in near anatomic alignment.
